# Patient Record
Sex: Female | Born: 2000 | Race: White | Hispanic: No | Marital: Single | Smoking: Never smoker
Health system: Southern US, Community
[De-identification: ages and names within clinical notes are randomized; demographics above are authoritative.]

## PROBLEM LIST (undated history)

## (undated) HISTORY — PX: EYE SURGERY: SHX253

---
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
## 2020-05-30 ENCOUNTER — Ambulatory Visit (INDEPENDENT_AMBULATORY_CARE_PROVIDER_SITE_OTHER): Payer: BC Managed Care – PPO | Admitting: Certified Nurse Midwife

## 2020-05-30 ENCOUNTER — Other Ambulatory Visit: Payer: Self-pay

## 2020-05-30 ENCOUNTER — Encounter: Payer: Self-pay | Admitting: Certified Nurse Midwife

## 2020-05-30 VITALS — BP 118/74 | HR 76 | Ht 66.0 in | Wt 130.8 lb

## 2020-05-30 DIAGNOSIS — N939 Abnormal uterine and vaginal bleeding, unspecified: Secondary | ICD-10-CM | POA: Diagnosis not present

## 2020-05-30 DIAGNOSIS — N898 Other specified noninflammatory disorders of vagina: Secondary | ICD-10-CM

## 2020-05-30 NOTE — Progress Notes (Signed)
GYN ENCOUNTER NOTE  Subjective:       Amy Collier is a 20 y.o. G0P0000 female is here for gynecologic evaluation of the following issues:  1. Increased bleeding with IUD in place. States the her period lasted 13-15 days. The bleeding has since stopped. She denies abdominal pain. States she has increased odor and request swab testing. .     Gynecologic History Patient's last menstrual period was 05/17/2020. Contraception: IUD Last Pap: n/a   mammogram: n/a  Obstetric History OB History  Gravida Para Term Preterm AB Living  0 0 0 0 0 0  SAB IAB Ectopic Multiple Live Births  0 0 0 0 0    History reviewed. No pertinent past medical history.  Past Surgical History:  Procedure Laterality Date  . EYE SURGERY      Current Outpatient Medications on File Prior to Visit  Medication Sig Dispense Refill  . levonorgestrel (MIRENA) 20 MCG/24HR IUD 1 each by Intrauterine route once.     No current facility-administered medications on file prior to visit.    No Known Allergies  Social History   Socioeconomic History  . Marital status: Single    Spouse name: Not on file  . Number of children: Not on file  . Years of education: Not on file  . Highest education level: Not on file  Occupational History  . Not on file  Tobacco Use  . Smoking status: Never Smoker  . Smokeless tobacco: Never Used  Vaping Use  . Vaping Use: Never used  Substance and Sexual Activity  . Alcohol use: Never  . Drug use: Never  . Sexual activity: Never    Birth control/protection: None  Other Topics Concern  . Not on file  Social History Narrative  . Not on file   Social Determinants of Health   Financial Resource Strain: Not on file  Food Insecurity: Not on file  Transportation Needs: Not on file  Physical Activity: Not on file  Stress: Not on file  Social Connections: Not on file  Intimate Partner Violence: Not on file    Family History  Problem Relation Age of Onset  . Diabetes  Father   . Diabetes Paternal Grandmother   . Diabetes Paternal Grandfather   . Stroke Paternal Grandfather     The following portions of the patient's history were reviewed and updated as appropriate: allergies, current medications, past family history, past medical history, past social history, past surgical history and problem list.  Review of Systems Review of Systems - Negative except as mentioned in HPI Review of Systems - General ROS: negative for - chills, fatigue, fever, hot flashes, malaise or night sweats Hematological and Lymphatic ROS: negative for - bleeding problems or swollen lymph nodes Gastrointestinal ROS: negative for - abdominal pain, blood in stools, change in bowel habits and nausea/vomiting Musculoskeletal ROS: negative for - joint pain, muscle pain or muscular weakness Genito-Urinary ROS: negative for -  dysmenorrhea, dyspareunia, dysuria, genital discharge, genital ulcers, hematuria, incontinence, irregular/heavy menses, nocturia or pelvic pain. positive for change in menstrual cycle and vaginal odor  Objective:   BP 118/74   Pulse 76   Ht 5\' 6"  (1.676 m)   Wt 130 lb 12.8 oz (59.3 kg)   LMP 05/17/2020   BMI 21.11 kg/m  CONSTITUTIONAL: Well-developed, well-nourished female in no acute distress.  HENT:  Normocephalic, atraumatic.  NECK: Normal range of motion, supple, no masses.  Normal thyroid.  SKIN: Skin is warm and dry. No rash  noted. Not diaphoretic. No erythema. No pallor. NEUROLGIC: Alert and oriented to person, place, and time. PSYCHIATRIC: Normal mood and affect. Normal behavior. Normal judgment and thought content. CARDIOVASCULAR:Not Examined RESPIRATORY: Not Examined BREASTS: Not Examined ABDOMEN: Soft, non distended; Non tender.  No Organomegaly. PELVIC:  External Genitalia: Normal  BUS: Normal  Vagina: Normal  Cervix: Normal, strings present   MUSCULOSKELETAL: Normal range of motion. No tenderness.  No cyanosis, clubbing, or  edema.     Assessment:  Abnormal uterine bleeding Vaginal odor   Plan:   Reassurance given that IUD is in place and no concerns . Discussed that irregular bleeding and spotting can occur with IUD. Discussed use of progesterone in the future should she have another prolonged cycle. Swab collected for vaginal odor. Will follow up with results.   Doreene Burke, CNM

## 2020-05-30 NOTE — Progress Notes (Signed)
GYN-Pt present due to having prolonged cycles since getting the IUD. Pt stated her last cycle lasted 13-15 days.

## 2020-05-30 NOTE — Patient Instructions (Signed)
Vaginitis  Vaginitis is a condition in which the vaginal tissue swells and becomes irritated. This condition is most often caused by a change in the normal balance of bacteria and yeast that live in the vagina. This change causes an overgrowth of certain bacteria or yeast, which causes the inflammation. There are different types of vaginitis. What are the causes? The cause of this condition depends on the type of vaginitis. It can be caused by:  Bacteria (bacterial vaginosis).  Yeast, which is a fungus (candidiasis).  A parasite (trichomoniasis vaginitis).  A virus (viral vaginitis).  Low hormone levels (atrophic vaginitis). Low hormone levels can occur during pregnancy, breastfeeding, or after menopause.  Irritants, such as bubble baths, scented tampons, and feminine sprays (allergic vaginitis). Other factors can change the normal balance of the yeast and bacteria that live in the vagina. These include:  Antibiotic medicines.  Poor hygiene.  Diaphragms, vaginal sponges, spermicides, birth control pills, and intrauterine devices (IUDs).  Sex.  Infection.  Uncontrolled diabetes.  A weakened body defense system (immune system). What increases the risk? This condition is more likely to develop in women who:  Smoke or are exposed to secondhand smoke.  Use vaginal douches, scented tampons, or scented sanitary pads.  Wear tight-fitting pants or thong underwear.  Use oral birth control pills or an IUD.  Have sex without a condom or have multiple partners.  Have an STI.  Frequently use the spermicide nonoxynol-9.  Eat lots of foods high in sugar or who have uncontrolled diabetes.  Have low estrogen levels.  Have a weakened immune system from an immune disorder or medical treatment.  Are pregnant or breastfeeding. What are the signs or symptoms? Symptoms vary depending on the cause of the vaginitis. Common symptoms include:  Abnormal vaginal discharge. ? The  discharge is white, gray, or yellow with bacterial vaginosis. ? The discharge is thick, white, and cheesy with a yeast infection. ? The discharge is frothy and yellow or greenish with trichomoniasis.  A bad vaginal smell. The smell is fishy with bacterial vaginosis.  Vaginal itching, pain, or swelling.  Pain with sex.  Pain or burning when urinating. Sometimes there are no symptoms. How is this diagnosed? This condition is diagnosed based on your symptoms and medical history. A physical exam, including a pelvic exam, will also be done. You may also have other tests, including:  Tests to determine the pH level (acidity or alkalinity) of your vagina.  A whiff test to assess the odor that results when a sample of your vaginal discharge is mixed with a potassium hydroxide solution.  Tests of vaginal fluid. A sample will be examined under a microscope. How is this treated? Treatment varies depending on the type of vaginitis you have. Your treatment may include:  Antibiotic creams or pills to treat bacterial vaginosis and trichomoniasis.  Antifungal medicines, such as vaginal creams or suppositories, to treat a yeast infection.  Medicine to ease discomfort if you have viral vaginitis. Your sexual partner should also be treated.  Estrogen delivered in a cream, pill, suppository, or vaginal ring to treat atrophic vaginitis. If vaginal dryness occurs, lubricants and moisturizing creams may help. You may need to avoid scented soaps, sprays, or douches.  Stopping use of a product that is causing allergic vaginitis and then using a vaginal cream to treat the symptoms. Follow these instructions at home: Lifestyle  Keep your genital area clean and dry. Avoid soap, and only rinse the area with water.  Do not douche   or use tampons until your health care provider says it is okay. Use sanitary pads, if needed.  Do not have sex until your health care provider approves. When you can return to sex,  practice safe sex and use condoms.  Wipe from front to back. This avoids the spread of bacteria from the rectum to the vagina. General instructions  Take over-the-counter and prescription medicines only as told by your health care provider.  If you were prescribed an antibiotic medicine, take or use it as told by your health care provider. Do not stop taking or using the antibiotic even if you start to feel better.  Keep all follow-up visits. This is important. How is this prevented?  Use mild, unscented products. Do not use things that can irritate the vagina, such as fabric softeners. Avoid the following products if they are scented: ? Feminine sprays. ? Detergents. ? Tampons. ? Feminine hygiene products. ? Soaps or bubble baths.  Let air reach your genital area. To do this: ? Wear cotton underwear to reduce moisture buildup. ? Avoid wearing underwear while you sleep. ? Avoid wearing tight pants and underwear or nylons without a cotton panel. ? Avoid wearing thong underwear.  Take off any wet clothing, such as bathing suits, as soon as possible.  Practice safe sex and use condoms. Contact a health care provider if:  You have abdominal or pelvic pain.  You have a fever or chills.  You have symptoms that last for more than 2-3 days. Get help right away if:  You have a fever and your symptoms suddenly get worse. Summary  Vaginitis is a condition in which the vaginal tissue becomes inflamed.This condition is most often caused by a change in the normal balance of bacteria and yeast that live in the vagina.  Treatment varies depending on the type of vaginitis you have.  Do not douche, use tampons, or have sex until your health care provider approves. When you can return to sex, practice safe sex and use condoms. This information is not intended to replace advice given to you by your health care provider. Make sure you discuss any questions you have with your health care  provider. Document Revised: 07/30/2019 Document Reviewed: 07/30/2019 Elsevier Patient Education  2021 [employer]  

## 2020-06-03 ENCOUNTER — Telehealth: Payer: Self-pay | Admitting: Certified Nurse Midwife

## 2020-06-03 NOTE — Telephone Encounter (Signed)
Patient called asking about lab results- states that provider told her she would have results within 2-3 days. I made patient aware I would send message. Please Advise.

## 2020-06-03 NOTE — Telephone Encounter (Signed)
Pattricia Boss do you remember doing a swab on this patient? I do not see where swab was ordered.

## 2020-06-06 ENCOUNTER — Other Ambulatory Visit: Payer: Self-pay | Admitting: Surgical

## 2020-06-06 ENCOUNTER — Other Ambulatory Visit (HOSPITAL_COMMUNITY)
Admission: RE | Admit: 2020-06-06 | Discharge: 2020-06-06 | Disposition: A | Discharge status: Home / Self Care | Payer: BC Managed Care – PPO | Source: Ambulatory Visit | Attending: Certified Nurse Midwife | Admitting: Certified Nurse Midwife

## 2020-06-06 ENCOUNTER — Other Ambulatory Visit: Payer: BC Managed Care – PPO

## 2020-06-06 ENCOUNTER — Other Ambulatory Visit: Payer: Self-pay

## 2020-06-06 DIAGNOSIS — N898 Other specified noninflammatory disorders of vagina: Secondary | ICD-10-CM

## 2020-06-06 MED ORDER — METRONIDAZOLE 500 MG PO TABS: 500.0000 mg | ORAL_TABLET | Freq: Two times a day (BID) | ORAL | 0 refills | Status: DC

## 2020-06-09 ENCOUNTER — Telehealth: Payer: Self-pay | Admitting: Certified Nurse Midwife

## 2020-06-09 LAB — CERVICOVAGINAL ANCILLARY ONLY
Bacterial Vaginitis (gardnerella): POSITIVE — AB
Candida Glabrata: NEGATIVE
Candida Vaginitis: NEGATIVE
Comment: NEGATIVE
Comment: NEGATIVE
Comment: NEGATIVE

## 2020-06-09 NOTE — Telephone Encounter (Signed)
Patient called asking about swab results- states that she has not heard anything. medication is helping some. Please Advise.

## 2020-06-09 NOTE — Telephone Encounter (Signed)
Pt aware swab + for BV. Encouraged pt to complete meds. Contact office if no better after completion on med.

## 2020-06-10 ENCOUNTER — Other Ambulatory Visit: Payer: Self-pay | Admitting: Certified Nurse Midwife

## 2020-07-05 ENCOUNTER — Other Ambulatory Visit: Payer: Self-pay

## 2020-07-05 MED ORDER — METRONIDAZOLE 500 MG PO TABS: 500.0000 mg | ORAL_TABLET | Freq: Two times a day (BID) | ORAL | 0 refills | Status: DC

## 2020-12-01 ENCOUNTER — Other Ambulatory Visit: Payer: Self-pay

## 2021-03-14 ENCOUNTER — Emergency Department
Admission: EM | Admit: 2021-03-14 | Discharge: 2021-03-14 | Disposition: A | Discharge status: Home / Self Care | Payer: BC Managed Care – PPO | Attending: Emergency Medicine | Admitting: Emergency Medicine

## 2021-03-14 ENCOUNTER — Emergency Department: Payer: BC Managed Care – PPO

## 2021-03-14 ENCOUNTER — Other Ambulatory Visit: Payer: Self-pay

## 2021-03-14 DIAGNOSIS — M542 Cervicalgia: Secondary | ICD-10-CM | POA: Insufficient documentation

## 2021-03-14 DIAGNOSIS — R110 Nausea: Secondary | ICD-10-CM | POA: Diagnosis not present

## 2021-03-14 DIAGNOSIS — Y92410 Unspecified street and highway as the place of occurrence of the external cause: Secondary | ICD-10-CM | POA: Diagnosis not present

## 2021-03-14 DIAGNOSIS — R519 Headache, unspecified: Secondary | ICD-10-CM | POA: Insufficient documentation

## 2021-03-14 DIAGNOSIS — M25519 Pain in unspecified shoulder: Secondary | ICD-10-CM | POA: Diagnosis not present

## 2021-03-14 MED ORDER — CYCLOBENZAPRINE HCL 5 MG PO TABS: 5.0000 mg | ORAL_TABLET | Freq: Three times a day (TID) | ORAL | 0 refills | Status: AC | PRN
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
## 2021-03-14 NOTE — Discharge Instructions (Addendum)
-  Return to the emergency department anytime if you begin to experience any new or worsening symptoms. -Take your medications as prescribed. -Follow-up with your primary care provider if you begin to experience any postconcussive symptoms.
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
## 2021-09-25 ENCOUNTER — Encounter: Payer: Self-pay | Admitting: Certified Nurse Midwife

## 2021-10-19 ENCOUNTER — Encounter: Payer: Self-pay | Admitting: Certified Nurse Midwife

## 2021-10-19 ENCOUNTER — Ambulatory Visit (INDEPENDENT_AMBULATORY_CARE_PROVIDER_SITE_OTHER): Payer: BC Managed Care – PPO | Admitting: Certified Nurse Midwife

## 2021-10-19 VITALS — BP 116/75 | HR 65 | Ht 68.0 in | Wt 135.0 lb

## 2021-10-19 DIAGNOSIS — N644 Mastodynia: Secondary | ICD-10-CM

## 2021-10-19 NOTE — Patient Instructions (Signed)
Breast Self-Awareness Breast self-awareness is knowing how your breasts look and feel. You need to: Check your breasts on a regular basis. Tell your doctor about any changes. Become familiar with the look and feel of your breasts. This can help you catch a breast problem while it is still small and can be treated. You should do breast self-exams even if you have breast implants. What you need: A mirror. A well-lit room. A pillow or other soft object. How to do a breast self-exam Follow these steps to do a breast self-exam: Look for changes  Take off all the clothes above your waist. Stand in front of a mirror in a room with good lighting. Put your hands down at your sides. Compare your breasts in the mirror. Look for any difference between them, such as: A difference in shape. A difference in size. Wrinkles, dips, and bumps in one breast and not the other. Look at each breast for changes in the skin, such as: Redness. Scaly areas. Skin that has gotten thicker. Dimpling. Open sores (ulcers). Look for changes in your nipples, such as: Fluid coming out of a nipple. Fluid around a nipple. Bleeding. Dimpling. Redness. A nipple that looks pushed in (retracted), or that has changed position. Feel for changes Lie on your back. Feel each breast. To do this: Pick a breast to feel. Place a pillow under the shoulder closest to that breast. Put the arm closest to that breast behind your head. Feel the nipple area of that breast using the hand of your other arm. Feel the area with the pads of your three middle fingers by making small circles with your fingers. Use light, medium, and firm pressure. Continue the overlapping circles, moving downward over the breast. Keep making circles with your fingers. Stop when you feel your ribs. Start making circles with your fingers again, this time going upward until you reach your collarbone. Then, make circles outward across your breast and into your  armpit area. Squeeze your nipple. Check for discharge and lumps. Repeat these steps to check your other breast. Sit or stand in the tub or shower. With soapy water on your skin, feel each breast the same way you did when you were lying down. Write down what you find Writing down what you find can help you remember what to tell your doctor. Write down: What is normal for each breast. Any changes you find in each breast. These include: The kind of changes you find. A tender or painful breast. Any lump you find. Write down its size and where it is. When you last had your monthly period (menstrual cycle). General tips If you are breastfeeding, the best time to check your breasts is after you feed your baby or after you use a breast pump. If you get monthly bleeding, the best time to check your breasts is 5-7 days after your monthly cycle ends. With time, you will become comfortable with the self-exam. You will also start to know if there are changes in your breasts. Contact a doctor if: You see a change in the shape or size of your breasts or nipples. You see a change in the skin of your breast or nipples, such as red or scaly skin. You have fluid coming from your nipples that is not normal. You find a new lump or thick area. You have breast pain. You have any concerns about your breast health. Summary Breast self-awareness includes looking for changes in your breasts and feeling for changes   within your breasts. You should do breast self-awareness in front of a mirror in a well-lit room. If you get monthly periods (menstrual cycles), the best time to check your breasts is 5-7 days after your period ends. Tell your doctor about any changes you see in your breasts. Changes include changes in size, changes on the skin, painful or tender breasts, or fluid from your nipples that is not normal. This information is not intended to replace advice given to you by your health care provider. Make sure  you discuss any questions you have with your health care provider. Document Revised: 12/01/2020 Document Reviewed: 12/01/2020 Elsevier Patient Education  2023 [employer]  

## 2021-10-19 NOTE — Progress Notes (Signed)
GYN ENCOUNTER NOTE  Subjective:       Amy Collier is a 21 y.o. G0P0000 female is here for gynecologic evaluation of the following issues:  1. Breast pain: pt states more on the left breast than the right. It occurs randomly 3-4 times a day for 30 min to 1 hr. Over the past 6 months. She has not tried any over the counter medications to help.  She denies any first degree relatives with breast cancer.    Gynecologic History No LMP recorded. (Menstrual status: IUD). Contraception: IUD Last Pap: has not had.  Last mammogram: n/a   Obstetric History OB History  Gravida Para Term Preterm AB Living  0 0 0 0 0 0  SAB IAB Ectopic Multiple Live Births  0 0 0 0 0    History reviewed. No pertinent past medical history.  Past Surgical History:  Procedure Laterality Date   EYE SURGERY      Current Outpatient Medications on File Prior to Visit  Medication Sig Dispense Refill   levonorgestrel (MIRENA) 20 MCG/24HR IUD 1 each by Intrauterine route once.     No current facility-administered medications on file prior to visit.    No Known Allergies  Social History   Socioeconomic History   Marital status: Single    Spouse name: Not on file   Number of children: Not on file   Years of education: Not on file   Highest education level: Not on file  Occupational History   Not on file  Tobacco Use   Smoking status: Never   Smokeless tobacco: Never  Vaping Use   Vaping Use: Never used  Substance and Sexual Activity   Alcohol use: Never   Drug use: Never   Sexual activity: Never    Birth control/protection: None  Other Topics Concern   Not on file  Social History Narrative   Not on file   Social Determinants of Health   Financial Resource Strain: Not on file  Food Insecurity: Not on file  Transportation Needs: Not on file  Physical Activity: Not on file  Stress: Not on file  Social Connections: Not on file  Intimate Partner Violence: Not on file    Family History   Problem Relation Age of Onset   Diabetes Father    Diabetes Paternal Grandmother    Diabetes Paternal Grandfather    Stroke Paternal Grandfather     The following portions of the patient's history were reviewed and updated as appropriate: allergies, current medications, past family history, past medical history, past social history, past surgical history and problem list.  Review of Systems Review of Systems - Negative except as mentioned in HPI Review of Systems - General ROS: negative for - chills, fatigue, fever, hot flashes, malaise or night sweats Hematological and Lymphatic ROS: negative for - bleeding problems or swollen lymph nodes Gastrointestinal ROS: negative for - abdominal pain, blood in stools, change in bowel habits and nausea/vomiting Musculoskeletal ROS: negative for - joint pain, muscle pain or muscular weakness Genito-Urinary ROS: negative for - change in menstrual cycle, dysmenorrhea, dyspareunia, dysuria, genital discharge, genital ulcers, hematuria, incontinence, irregular/heavy menses, nocturia or pelvic pain  Objective:   BP 116/75   Pulse 65   Ht 5\' 8"  (1.727 m)   Wt 135 lb (61.2 kg)   BMI 20.53 kg/m  CONSTITUTIONAL: Well-developed, well-nourished female in no acute distress.  HENT:  Normocephalic, atraumatic.  NECK: Normal range of motion, supple, no masses.  Normal thyroid.  SKIN:  Skin is warm and dry. No rash noted. Not diaphoretic. No erythema. No pallor. NEUROLGIC: Alert and oriented to person, place, and time. PSYCHIATRIC: Normal mood and affect. Normal behavior. Normal judgment and thought content. CARDIOVASCULAR:Not Examined RESPIRATORY: Not Examined BREASTS: Breasts: breasts appear normal, no suspicious masses, no skin or nipple changes or axillary nodes. Dense fibrous tissue noted bilaterally. Tender over fibrous areas with palpation.  ABDOMEN: Soft, non distended; Non tender.  No Organomegaly. PELVIC:not indicated MUSCULOSKELETAL: Normal range  of motion. No tenderness.  No cyanosis, clubbing, or edema.     Assessment:   Breast pain    Plan:   Discussed common causes of breast pain and self help measures. Discussed good supportive bra, dietary changes, use of warm compress, over the counter medications and herbal supplements.  Reassurance given . Handout on breast pain given. Follow up PRN for annual exam.   Doreene Burke, CNM

## 2022-09-08 ENCOUNTER — Encounter: Payer: Self-pay | Admitting: Certified Nurse Midwife

## 2022-12-04 ENCOUNTER — Ambulatory Visit: Payer: BC Managed Care – PPO | Admitting: Obstetrics & Gynecology

## 2022-12-04 ENCOUNTER — Other Ambulatory Visit (HOSPITAL_COMMUNITY)
Admission: RE | Admit: 2022-12-04 | Discharge: 2022-12-04 | Disposition: A | Discharge status: Home / Self Care | Payer: BC Managed Care – PPO | Source: Ambulatory Visit | Attending: Certified Nurse Midwife | Admitting: Certified Nurse Midwife

## 2022-12-04 ENCOUNTER — Encounter: Payer: Self-pay | Admitting: Obstetrics & Gynecology

## 2022-12-04 ENCOUNTER — Ambulatory Visit (INDEPENDENT_AMBULATORY_CARE_PROVIDER_SITE_OTHER): Payer: BC Managed Care – PPO | Admitting: Obstetrics & Gynecology

## 2022-12-04 VITALS — BP 107/58 | HR 54 | Ht 67.0 in | Wt 136.0 lb

## 2022-12-04 DIAGNOSIS — Z124 Encounter for screening for malignant neoplasm of cervix: Secondary | ICD-10-CM | POA: Diagnosis present

## 2022-12-04 DIAGNOSIS — Z01419 Encounter for gynecological examination (general) (routine) without abnormal findings: Secondary | ICD-10-CM

## 2022-12-04 DIAGNOSIS — Z113 Encounter for screening for infections with a predominantly sexual mode of transmission: Secondary | ICD-10-CM

## 2022-12-04 NOTE — Progress Notes (Signed)
GYNECOLOGY ANNUAL PHYSICAL EXAM PROGRESS NOTE  Subjective:    Amy Collier is a 22 y.o. single G0P0000  who presents for an annual exam.  The patient is sexually active. The patient participates in regular exercise: yes. Has the patient ever been transfused or tattooed?: yes. The patient reports that there is not domestic violence in her life.     Menstrual History:  Patient's last menstrual period was 11/28/2022. Period Cycle (Days): 28 Period Duration (Days): 1-5 Period Pattern: Regular Menstrual Flow: Light Dysmenorrhea: None   Gynecologic History:  Contraception:  Mirena, has short monthy periods  With her current partner for 9 months, denies dyspareunia  FH- no breast, gyn, colon cancers     Upstream - 12/04/22 1530       Pregnancy Intention Screening   Does the patient want to become pregnant in the next year? No    Does the patient's partner want to become pregnant in the next year? No    Would the patient like to discuss contraceptive options today? No      Contraception Wrap Up   Current Method IUD or IUS    End Method IUD or IUS    Contraception Counseling Provided No               OB History  Gravida Para Term Preterm AB Living  0 0 0 0 0 0  SAB IAB Ectopic Multiple Live Births  0 0 0 0 0    No past medical history on file.  Past Surgical History:  Procedure Laterality Date   EYE SURGERY      Family History  Problem Relation Age of Onset   Diabetes Father    Diabetes Paternal Grandmother    Diabetes Paternal Grandfather    Stroke Paternal Grandfather     Social History   Socioeconomic History   Marital status: Single    Spouse name: Not on file   Number of children: Not on file   Years of education: Not on file   Highest education level: Not on file  Occupational History   Not on file  Tobacco Use   Smoking status: Never   Smokeless tobacco: Never  Vaping Use   Vaping status: Never Used  Substance and Sexual  Activity   Alcohol use: Never   Drug use: Never   Sexual activity: Yes    Birth control/protection: I.U.D.  Other Topics Concern   Not on file  Social History Narrative   Not on file   Social Determinants of Health   Financial Resource Strain: Not on file  Food Insecurity: Not on file  Transportation Needs: Not on file  Physical Activity: Not on file  Stress: Not on file  Social Connections: Not on file  Intimate Partner Violence: Not on file    Current Outpatient Medications on File Prior to Visit  Medication Sig Dispense Refill   levonorgestrel (MIRENA) 20 MCG/24HR IUD 1 each by Intrauterine route once.     No current facility-administered medications on file prior to visit.    No Known Allergies   Review of Systems Constitutional: negative for chills, fatigue, fevers and sweats Eyes: negative for irritation, redness and visual disturbance Ears, nose, mouth, throat, and face: negative for hearing loss, nasal congestion, snoring and tinnitus Respiratory: negative for asthma, cough, sputum Cardiovascular: negative for chest pain, dyspnea, exertional chest pressure/discomfort, irregular heart beat, palpitations and syncope Gastrointestinal: negative for abdominal pain, change in bowel habits, nausea and vomiting Genitourinary:  negative for abnormal menstrual periods, genital lesions, sexual problems and vaginal discharge, dysuria and urinary incontinence Integument/breast: negative for breast lump, breast tenderness and nipple discharge Hematologic/lymphatic: negative for bleeding and easy bruising Musculoskeletal:negative for back pain and muscle weakness Neurological: negative for dizziness, headaches, vertigo and weakness Endocrine: negative for diabetic symptoms including polydipsia, polyuria and skin dryness Allergic/Immunologic: negative for hay fever and urticaria      Objective:  Blood pressure (!) 107/58, pulse (!) 54, height 5\' 7"  (1.702 m), weight 136 lb  (61.7 kg), last menstrual period 11/28/2022. Body mass index is 21.3 kg/m.    General Appearance:    Alert, cooperative, no distress, appears stated age  Head:    Normocephalic, without obvious abnormality, atraumatic  Eyes:    PERRL, conjunctiva/corneas clear, EOM's intact, both eyes  Ears:    Normal external ear canals, both ears  Nose:   Nares normal, septum midline, mucosa normal, no drainage or sinus tenderness  Throat:   Lips, mucosa, and tongue normal; teeth and gums normal  Neck:   Supple, symmetrical, trachea midline, no adenopathy; thyroid: no enlargement/tenderness/nodules; no carotid bruit or JVD  Back:     Symmetric, no curvature, ROM normal, no CVA tenderness  Lungs:     Clear to auscultation bilaterally, respirations unlabored  Chest Wall:    No tenderness or deformity   Heart:    Regular rate and rhythm, S1 and S2 normal, no murmur, rub or gallop  Breast Exam:    No tenderness, masses, or nipple abnormality  Abdomen:     Soft, non-tender, bowel sounds active all four quadrants, no masses, no organomegaly.    Genitalia:    Pelvic:external genitalia normal, vagina without lesions, discharge, or tenderness, rectovaginal septum  normal. Cervix normal in appearance, no cervical motion tenderness, no adnexal masses or tenderness.  Uterus normal size, shape, retroverted, minimally mobile, regular contours, nontender. Mirena strings seen, about 2 cm long  Rectal:    No hemorrhoids appreciated. Internal exam not done.   Extremities:   Extremities normal, atraumatic, no cyanosis or edema  Pulses:   2+ and symmetric all extremities  Skin:   Skin color, texture, turgor normal, no rashes or lesions  Lymph nodes:   Cervical, supraclavicular, and axillary nodes normal  Neurologic:   CNII-XII intact, normal strength, sensation and reflexes throughout   .   Assessment:   Well woman exam   Plan:  Blood tests: sti screening rec'd Breast self exam technique reviewed and patient  encouraged to perform self-exam monthly. Contraception: IUD. Pap smear ordered. Flu vaccine: declines Follow up in 1 year for annual exam   Allie Bossier, MD [facility]

## 2022-12-12 LAB — CYTOLOGY - PAP
Chlamydia: NEGATIVE
Comment: NEGATIVE
Comment: NORMAL
Diagnosis: NEGATIVE
Diagnosis: REACTIVE
Neisseria Gonorrhea: NEGATIVE

## 2023-06-24 ENCOUNTER — Encounter: Payer: Self-pay | Admitting: Certified Nurse Midwife

## 2023-08-02 ENCOUNTER — Ambulatory Visit: Admitting: Certified Nurse Midwife

## 2023-08-02 ENCOUNTER — Encounter: Payer: Self-pay | Admitting: Certified Nurse Midwife

## 2023-08-02 VITALS — BP 136/80 | HR 50 | Wt 143.8 lb

## 2023-08-02 DIAGNOSIS — Z30432 Encounter for removal of intrauterine contraceptive device: Secondary | ICD-10-CM | POA: Diagnosis not present

## 2023-08-02 NOTE — Progress Notes (Signed)
   GYNECOLOGY OFFICE PROCEDURE NOTE  Amy Collier is a 23 y.o. G0P0000 here for Mirena  IUD removal. No GYN concerns.  Last pap smear was on 12/04/2022 and was normal.  IUD Removal  Patient identified, informed consent performed, consent signed.   Chaperone present.  Patient was placed in the dorsal lithotomy position, normal external genitalia was noted.  A speculum was placed in the patient's vagina, normal discharge was noted, no lesions. The cervix was visualized, no lesions, no abnormal discharge.  The strings of the IUD were grasped and pulled using ring forceps. The IUD was removed in its entirety.   Patient tolerated the procedure well.    Patient plans for pregnancy soon and she was told to avoid teratogens, take PNV and folic acid.  Routine preventative health maintenance measures emphasized.   Alise Appl, CNM

## 2024-01-21 IMAGING — CT CT CERVICAL SPINE W/O CM
3 of 4 series · 12 of 33 positions shown, 14 images · non-contrast
Comparison: None.

CLINICAL DATA: Neck trauma, midline tenderness



[Series 6: sagittal bone · sagittal · 0.29mm/px · 5 of 61 slices shown, 6 images]
[im 21/61  bone]
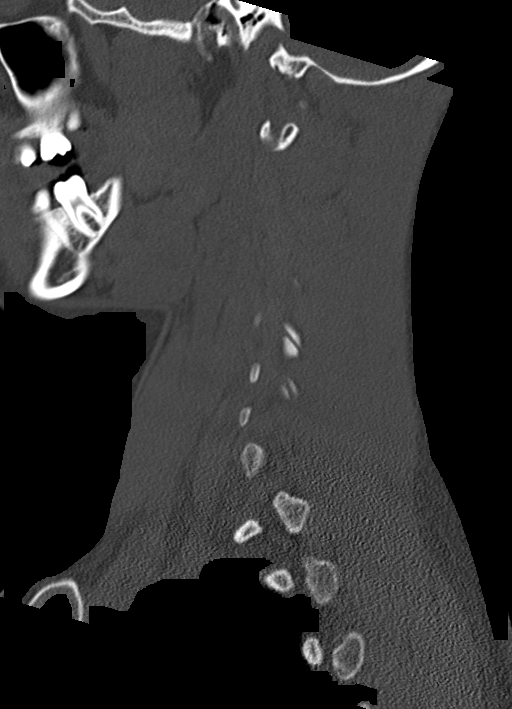
[im 26/61  bone]
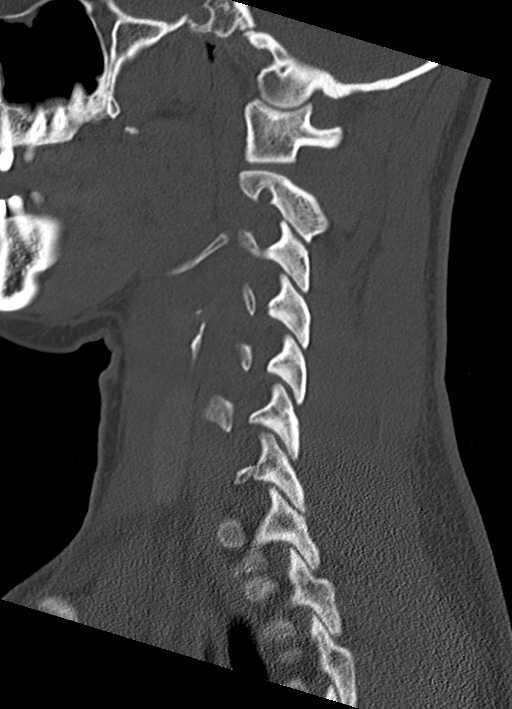
[im 31/61  soft-tissue]
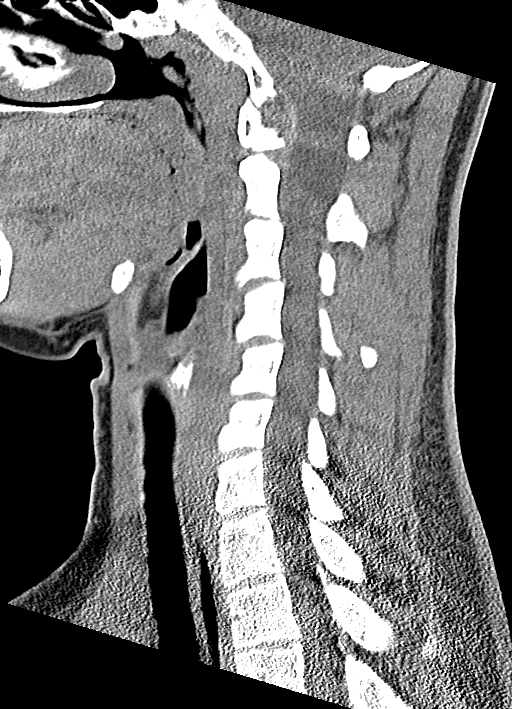
[im 31/61  bone]
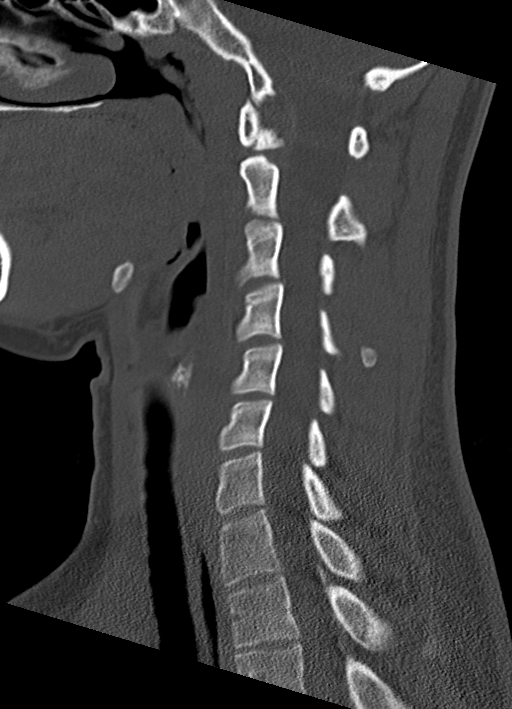
[im 36/61  bone]
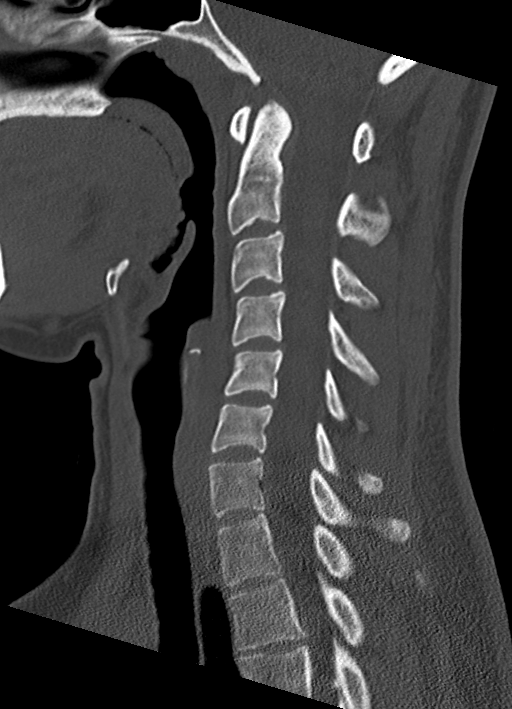
[im 41/61  bone]
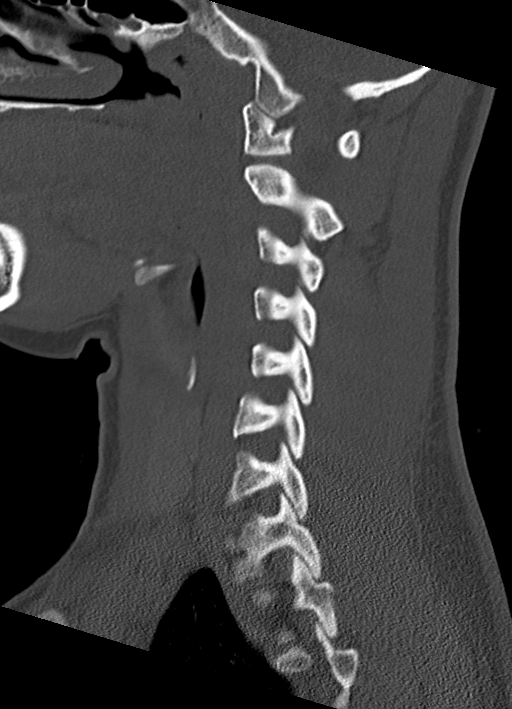

[Series 7: coronal bone · coronal · 0.31mm/px · 3 of 40 slices shown]
[im 8/40  bone]
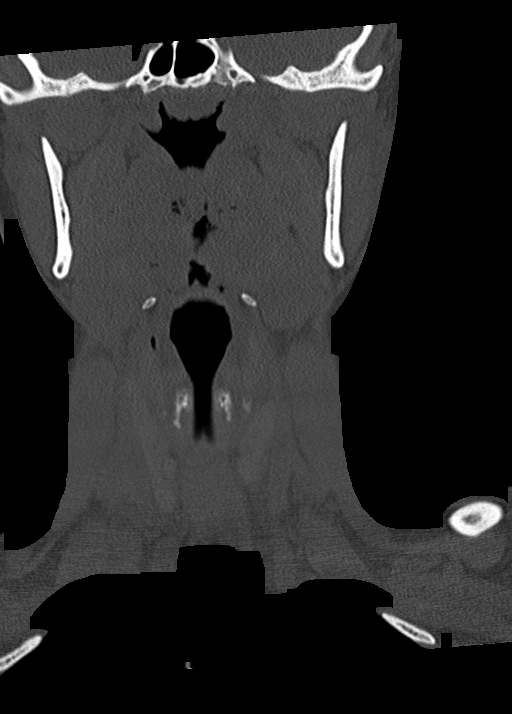
[im 16/40  bone]
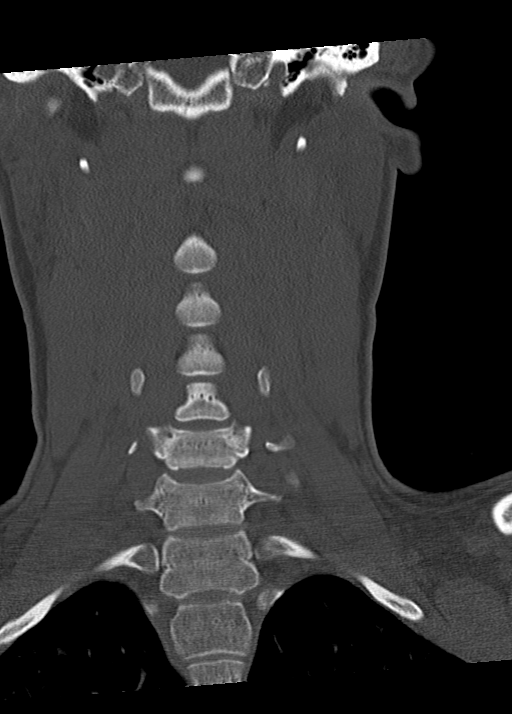
[im 24/40  bone]
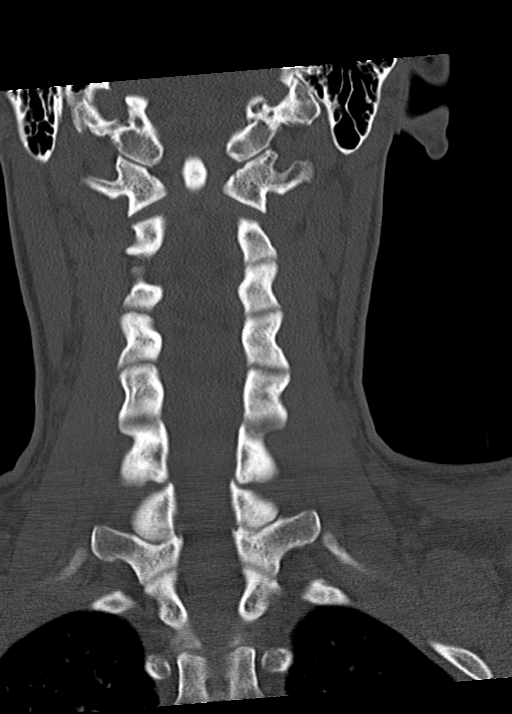

[Series 8: orthogonal bone · axial · 0.21mm/px · z∈[-222,-99]mm · 4 of 100 slices shown, 5 images]
[im 17/100  soft-tissue]
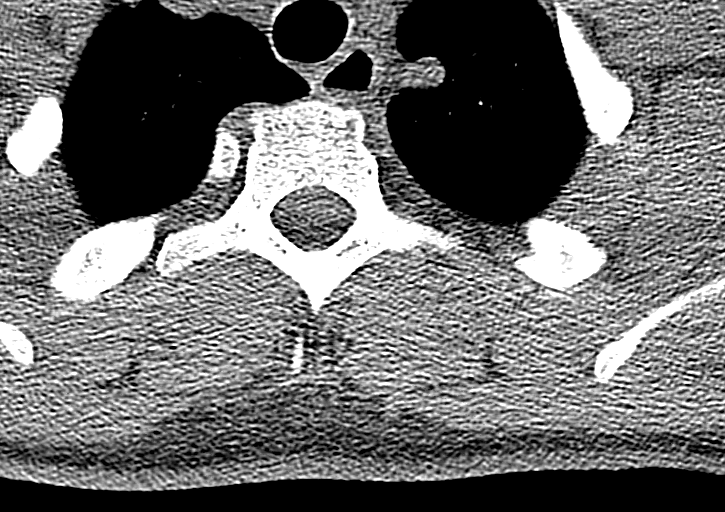
[im 17/100  bone]
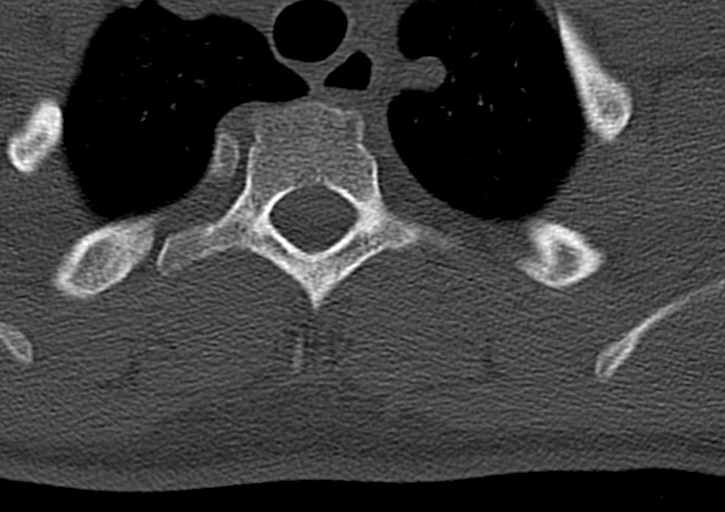
[im 34/100  bone]
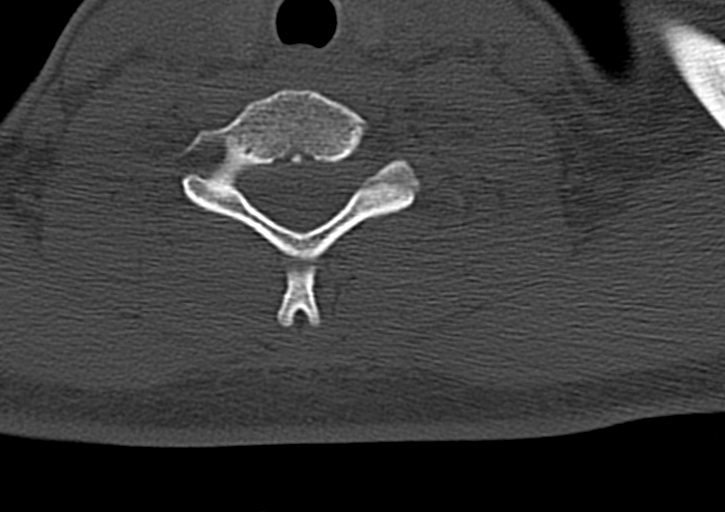
[im 67/100  bone]
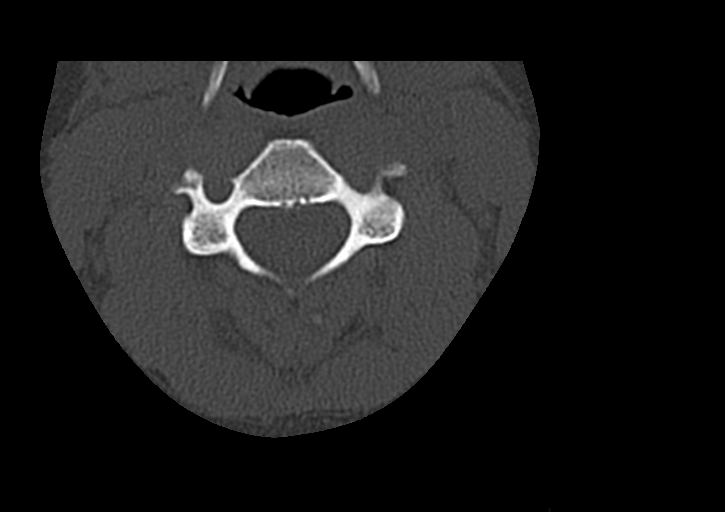
[im 83/100  bone]
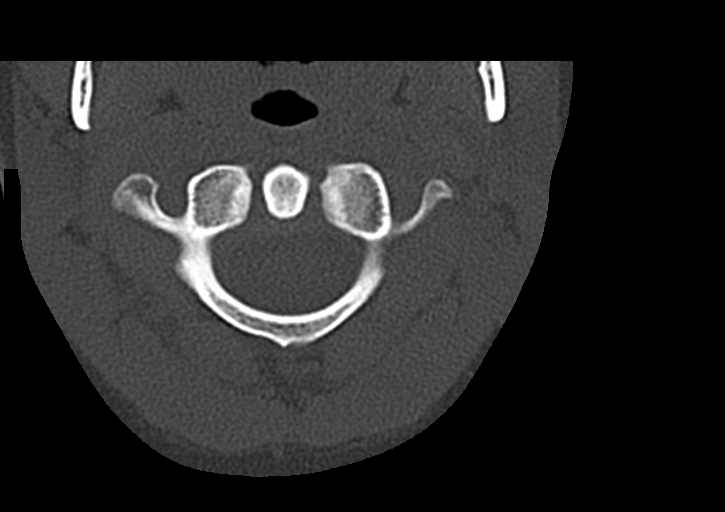

[12 of 33 positions shown; findings below may reference images not displayed]

FINDINGS: Alignment: Slight straightening of the lower cervical spine is
likely positional. Otherwise alignment is anatomic.

Skull base and vertebrae: No acute fracture. No primary bone lesion
or focal pathologic process.

Soft tissues and spinal canal: No prevertebral fluid or swelling. No
visible canal hematoma.

Disc levels:  No significant spondylosis or facet hypertrophy.

Upper chest: Airway is patent.  Lung apices are clear.

Other: Reconstructed images demonstrate no additional findings.
IMPRESSION: 1. No acute cervical spine fracture.

## 2024-03-30 ENCOUNTER — Ambulatory Visit
